# Patient Record
Sex: Female | Born: 1995 | Race: White | Hispanic: No | Marital: Single | Smoking: Current some day smoker
Health system: Southern US, Community
[De-identification: ages and names within clinical notes are randomized; demographics above are authoritative.]

## PROBLEM LIST (undated history)

## (undated) DIAGNOSIS — F329 Major depressive disorder, single episode, unspecified: Secondary | ICD-10-CM

## (undated) DIAGNOSIS — F419 Anxiety disorder, unspecified: Secondary | ICD-10-CM

## (undated) DIAGNOSIS — S82899A Other fracture of unspecified lower leg, initial encounter for closed fracture: Secondary | ICD-10-CM

## (undated) DIAGNOSIS — B958 Unspecified staphylococcus as the cause of diseases classified elsewhere: Secondary | ICD-10-CM

## (undated) DIAGNOSIS — F32A Depression, unspecified: Secondary | ICD-10-CM

## (undated) HISTORY — DX: Depression, unspecified: F32.A

## (undated) HISTORY — PX: ORIF ANKLE FRACTURE: SUR919

## (undated) HISTORY — DX: Anxiety disorder, unspecified: F41.9

## (undated) HISTORY — DX: Major depressive disorder, single episode, unspecified: F32.9

---
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
## 2015-02-21 ENCOUNTER — Ambulatory Visit: Payer: Self-pay
